# Patient Record
Sex: Female | Born: 1935 | Race: White | Hispanic: No
Health system: Southern US, Community
[De-identification: ages and names within clinical notes are randomized; demographics above are authoritative.]

---

## 2004-12-22 ENCOUNTER — Ambulatory Visit: Payer: Self-pay | Admitting: *Deleted

## 2005-08-29 ENCOUNTER — Ambulatory Visit: Payer: Self-pay | Admitting: Family Medicine

## 2005-09-14 ENCOUNTER — Ambulatory Visit: Payer: Self-pay | Admitting: Family Medicine

## 2005-10-07 ENCOUNTER — Emergency Department: Payer: Self-pay | Admitting: Emergency Medicine

## 2005-10-12 ENCOUNTER — Ambulatory Visit: Payer: Self-pay | Admitting: Family Medicine

## 2006-08-09 ENCOUNTER — Ambulatory Visit: Payer: Self-pay | Admitting: Family Medicine

## 2007-05-06 ENCOUNTER — Ambulatory Visit: Payer: Self-pay | Admitting: Vascular Surgery

## 2007-11-11 ENCOUNTER — Encounter: Payer: Self-pay | Admitting: Internal Medicine

## 2007-11-13 ENCOUNTER — Encounter: Payer: Self-pay | Admitting: Internal Medicine

## 2007-12-13 ENCOUNTER — Encounter: Payer: Self-pay | Admitting: Internal Medicine

## 2008-01-24 ENCOUNTER — Encounter: Payer: Self-pay | Admitting: Internal Medicine

## 2008-02-07 ENCOUNTER — Inpatient Hospital Stay: Payer: Self-pay | Admitting: Vascular Surgery

## 2008-02-12 ENCOUNTER — Encounter: Payer: Self-pay | Admitting: Internal Medicine

## 2008-02-27 ENCOUNTER — Observation Stay: Payer: Self-pay | Admitting: Vascular Surgery

## 2008-02-27 ENCOUNTER — Other Ambulatory Visit: Payer: Self-pay

## 2008-03-14 ENCOUNTER — Encounter: Payer: Self-pay | Admitting: Internal Medicine

## 2008-04-14 ENCOUNTER — Encounter: Payer: Self-pay | Admitting: Internal Medicine

## 2008-10-14 ENCOUNTER — Ambulatory Visit: Payer: Self-pay | Admitting: Vascular Surgery

## 2009-03-18 ENCOUNTER — Ambulatory Visit: Payer: Self-pay | Admitting: Internal Medicine

## 2009-03-23 ENCOUNTER — Ambulatory Visit: Payer: Self-pay | Admitting: Internal Medicine

## 2009-03-26 ENCOUNTER — Ambulatory Visit: Payer: Self-pay | Admitting: Internal Medicine

## 2009-04-02 ENCOUNTER — Ambulatory Visit: Payer: Self-pay | Admitting: Internal Medicine

## 2009-10-27 ENCOUNTER — Ambulatory Visit: Payer: Self-pay | Admitting: Internal Medicine

## 2009-11-11 ENCOUNTER — Ambulatory Visit: Payer: Self-pay | Admitting: Internal Medicine

## 2009-11-19 ENCOUNTER — Ambulatory Visit: Payer: Self-pay | Admitting: Internal Medicine

## 2009-12-01 ENCOUNTER — Ambulatory Visit: Payer: Self-pay | Admitting: Internal Medicine

## 2009-12-08 ENCOUNTER — Ambulatory Visit: Payer: Self-pay | Admitting: Internal Medicine

## 2010-03-24 ENCOUNTER — Inpatient Hospital Stay: Payer: Self-pay | Admitting: Specialist

## 2010-06-07 ENCOUNTER — Ambulatory Visit: Payer: Self-pay | Admitting: Internal Medicine

## 2010-06-16 ENCOUNTER — Ambulatory Visit: Payer: Self-pay | Admitting: Internal Medicine

## 2010-06-30 ENCOUNTER — Ambulatory Visit: Payer: Self-pay | Admitting: Internal Medicine

## 2011-05-29 ENCOUNTER — Ambulatory Visit: Payer: Self-pay | Admitting: Vascular Surgery

## 2011-07-26 ENCOUNTER — Other Ambulatory Visit: Payer: Self-pay | Admitting: Family Medicine

## 2012-01-24 ENCOUNTER — Ambulatory Visit: Payer: Self-pay | Admitting: Vascular Surgery

## 2012-01-24 LAB — CBC
HCT: 37.9 % (ref 35.0–47.0)
MCHC: 32.5 g/dL (ref 32.0–36.0)
MCV: 96 fL (ref 80–100)
RDW: 13.7 % (ref 11.5–14.5)

## 2012-01-24 LAB — BASIC METABOLIC PANEL
Anion Gap: 8 (ref 7–16)
Calcium, Total: 9 mg/dL (ref 8.5–10.1)
Chloride: 103 mmol/L (ref 98–107)
Creatinine: 1.24 mg/dL (ref 0.60–1.30)
EGFR (African American): 48 — ABNORMAL LOW
EGFR (Non-African Amer.): 42 — ABNORMAL LOW
Glucose: 152 mg/dL — ABNORMAL HIGH (ref 65–99)
Osmolality: 283 (ref 275–301)
Sodium: 136 mmol/L (ref 136–145)

## 2012-01-25 ENCOUNTER — Emergency Department: Payer: Self-pay | Admitting: Unknown Physician Specialty

## 2012-01-25 LAB — COMPREHENSIVE METABOLIC PANEL
Alkaline Phosphatase: 97 U/L (ref 50–136)
Anion Gap: 6 — ABNORMAL LOW (ref 7–16)
BUN: 39 mg/dL — ABNORMAL HIGH (ref 7–18)
Bilirubin,Total: 0.4 mg/dL (ref 0.2–1.0)
Creatinine: 1.34 mg/dL — ABNORMAL HIGH (ref 0.60–1.30)
EGFR (African American): 44 — ABNORMAL LOW
EGFR (Non-African Amer.): 38 — ABNORMAL LOW
Glucose: 191 mg/dL — ABNORMAL HIGH (ref 65–99)
Osmolality: 288 (ref 275–301)
SGOT(AST): 25 U/L (ref 15–37)
Total Protein: 7.4 g/dL (ref 6.4–8.2)

## 2012-01-25 LAB — URINALYSIS, COMPLETE
Blood: NEGATIVE
Leukocyte Esterase: NEGATIVE
Nitrite: NEGATIVE
Ph: 5 (ref 4.5–8.0)
RBC,UR: 1 /HPF (ref 0–5)
Squamous Epithelial: 1

## 2012-01-25 LAB — CBC
HCT: 37 % (ref 35.0–47.0)
MCH: 31.4 pg (ref 26.0–34.0)
Platelet: 262 10*3/uL (ref 150–440)
RBC: 3.85 10*6/uL (ref 3.80–5.20)
WBC: 10.7 10*3/uL (ref 3.6–11.0)

## 2012-01-25 LAB — PROTIME-INR: Prothrombin Time: 13.5 secs (ref 11.5–14.7)

## 2012-01-25 LAB — TROPONIN I: Troponin-I: <0.02 ng/mL

## 2012-01-31 ENCOUNTER — Inpatient Hospital Stay: Payer: Self-pay | Admitting: Vascular Surgery

## 2012-01-31 LAB — CBC WITH DIFFERENTIAL/PLATELET
Basophil #: 0.1 10*3/uL (ref 0.0–0.1)
Comment - H1-Com1: NORMAL
Comment - H1-Com2: NORMAL
Eosinophil #: 0 10*3/uL (ref 0.0–0.7)
Eosinophil %: 0.1 %
HCT: 35.2 % (ref 35.0–47.0)
Lymphocyte #: 1 10*3/uL (ref 1.0–3.6)
Lymphocyte %: 3.8 %
Lymphocytes: 5 %
MCH: 30.9 pg (ref 26.0–34.0)
MCV: 98 fL (ref 80–100)
Monocyte %: 5.1 %
Monocytes: 3 %
Neutrophil #: 25.1 10*3/uL — ABNORMAL HIGH (ref 1.4–6.5)
Neutrophil %: 90.5 %
Platelet: 307 10*3/uL (ref 150–440)
RBC: 3.6 10*6/uL — ABNORMAL LOW (ref 3.80–5.20)
Segmented Neutrophils: 91 %
WBC: 27.7 10*3/uL — ABNORMAL HIGH (ref 3.6–11.0)

## 2012-01-31 LAB — CULTURE, BLOOD (SINGLE)

## 2012-02-01 LAB — CBC WITH DIFFERENTIAL/PLATELET
Basophil #: 0 10*3/uL (ref 0.0–0.1)
Basophil %: 0.1 %
Eosinophil #: 0 10*3/uL (ref 0.0–0.7)
Eosinophil %: 0 %
HCT: 32.2 % — ABNORMAL LOW (ref 35.0–47.0)
Lymphocyte #: 1.4 10*3/uL (ref 1.0–3.6)
Lymphocyte %: 9.9 %
MCHC: 33.2 g/dL (ref 32.0–36.0)
MCV: 96 fL (ref 80–100)
Monocyte #: 1 x10 3/mm — ABNORMAL HIGH (ref 0.2–0.9)
Monocyte %: 6.6 %
Neutrophil #: 12.2 10*3/uL — ABNORMAL HIGH (ref 1.4–6.5)
Neutrophil %: 83.4 %
Platelet: 304 10*3/uL (ref 150–440)
RBC: 3.35 10*6/uL — ABNORMAL LOW (ref 3.80–5.20)
RDW: 13.5 % (ref 11.5–14.5)

## 2012-02-01 LAB — BASIC METABOLIC PANEL
BUN: 32 mg/dL — ABNORMAL HIGH (ref 7–18)
Calcium, Total: 8.5 mg/dL (ref 8.5–10.1)
Chloride: 106 mmol/L (ref 98–107)
Co2: 27 mmol/L (ref 21–32)
Creatinine: 1.45 mg/dL — ABNORMAL HIGH (ref 0.60–1.30)
EGFR (Non-African Amer.): 34 — ABNORMAL LOW
Glucose: 203 mg/dL — ABNORMAL HIGH (ref 65–99)
Osmolality: 292 (ref 275–301)
Potassium: 4.4 mmol/L (ref 3.5–5.1)

## 2012-02-01 LAB — CK-MB
CK-MB: 5.8 ng/mL — ABNORMAL HIGH (ref 0.5–3.6)
CK-MB: 3.9 ng/mL — ABNORMAL HIGH (ref 0.5–3.6)

## 2012-02-01 LAB — TROPONIN I: Troponin-I: 0.03 ng/mL

## 2012-08-18 IMAGING — CT CT HEAD WITHOUT CONTRAST
3 of 4 series · 17 of 30 positions shown, 19 images · non-contrast
Comparison: none

REASON FOR EXAM: ams cva
COMMENTS:

PROCEDURE:     CT  - CT HEAD WITHOUT CONTRAST  - January 25, 2012  [DATE]
RESULT:     Head CT dated 01/25/2012.
TECHNIQUE: Helical noncontrasted 5 mm sections were obtained from the skull
base to the vertex. There is made to prior study dated 03/24/2010.

[Series 2: without · axial · non-contrast · 0.43mm/px · z∈[+408,+512]mm · 5 of 33 slices shown]
[im 6/33  brain]
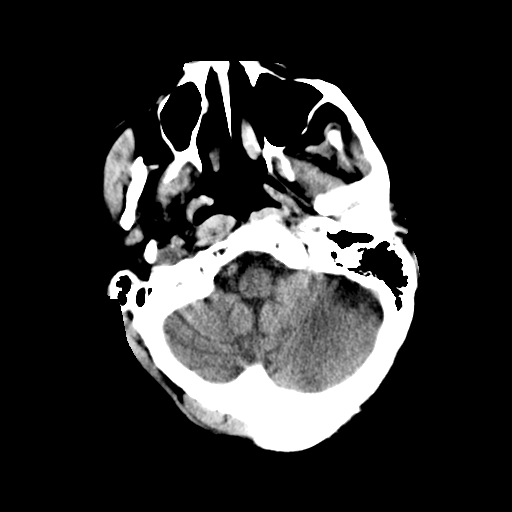
[im 11/33  brain]
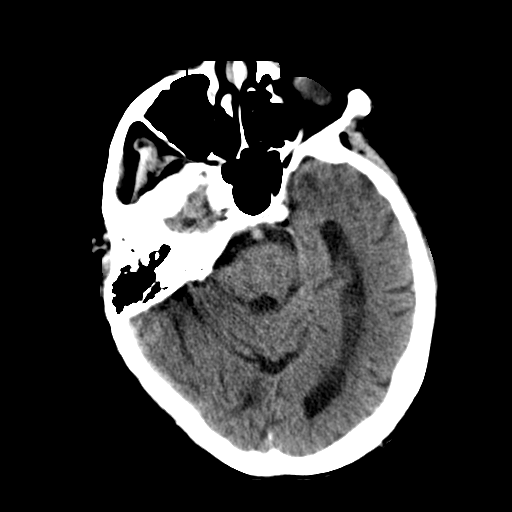
[im 17/33  brain]
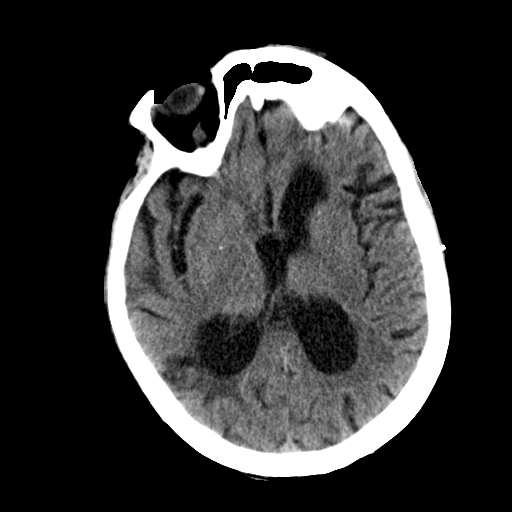
[im 22/33  brain]
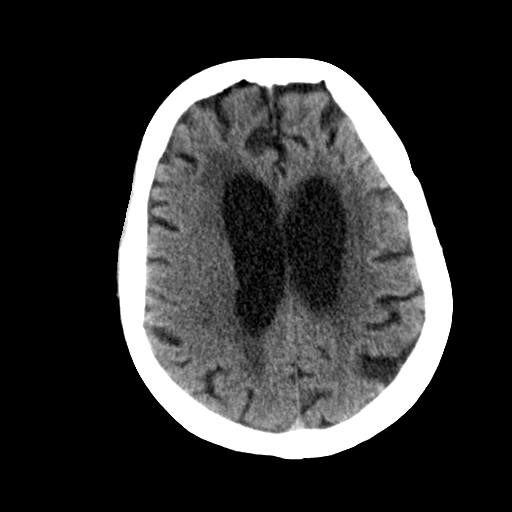
[im 27/33  brain]
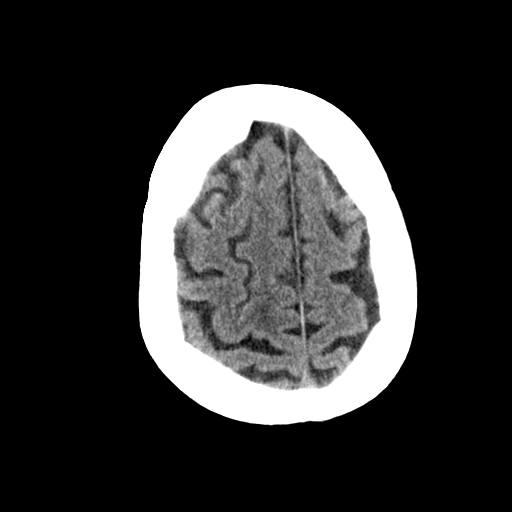

[Series 4: without recon · axial · non-contrast · 0.43mm/px · z∈[+425,+543]mm · 6 of 34 slices shown, 8 images]
[im 5/34  brain]
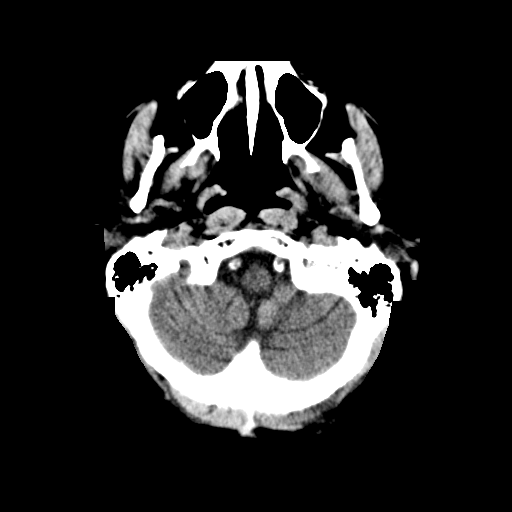
[im 5/34  bone]
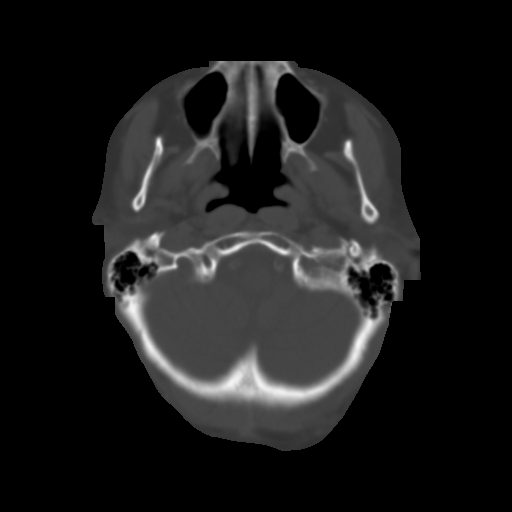
[im 10/34  brain]
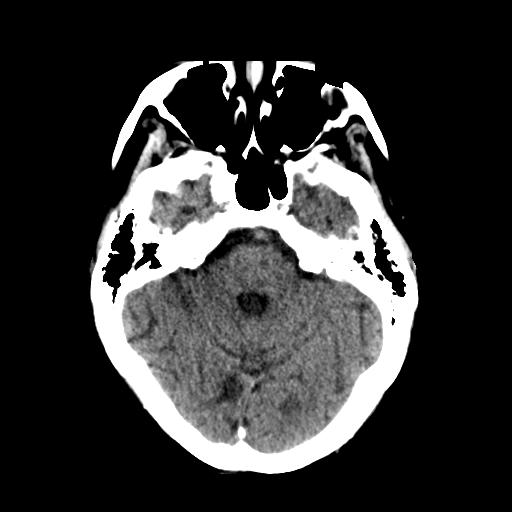
[im 15/34  brain]
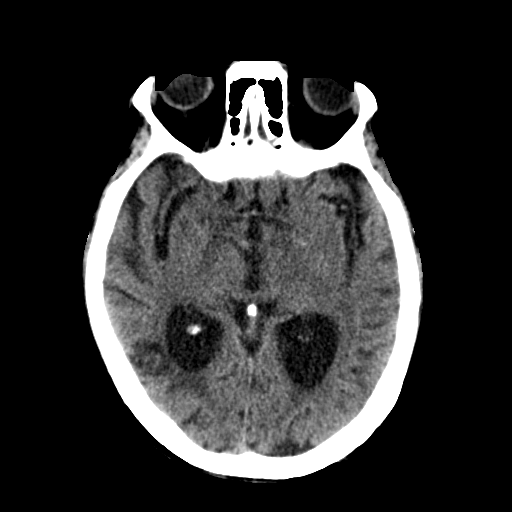
[im 19/34  brain]
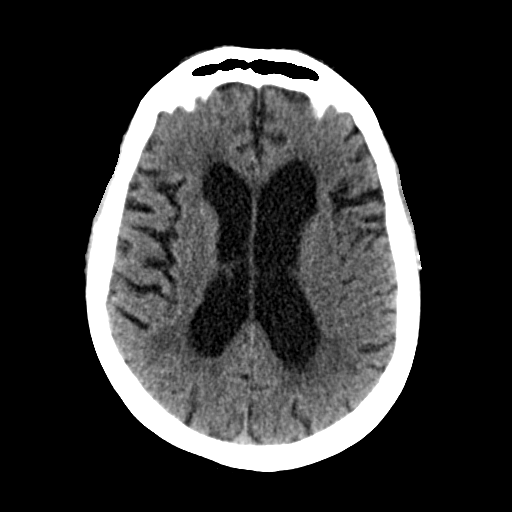
[im 24/34  brain]
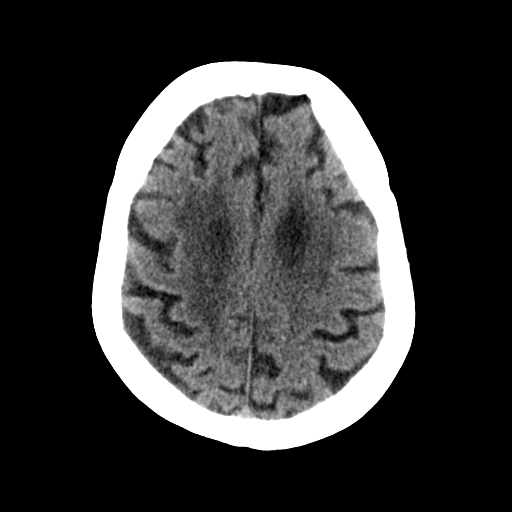
[im 24/34  bone]
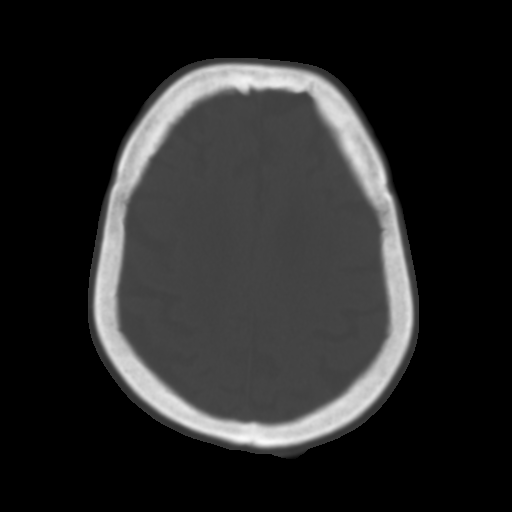
[im 29/34  brain]
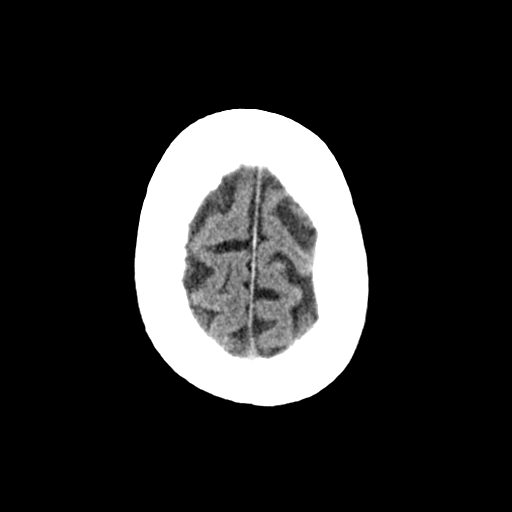

[Series 5: bone recon · axial · 0.43mm/px · z∈[+425,+543]mm · 6 of 34 slices shown]
[im 5/34  bone]
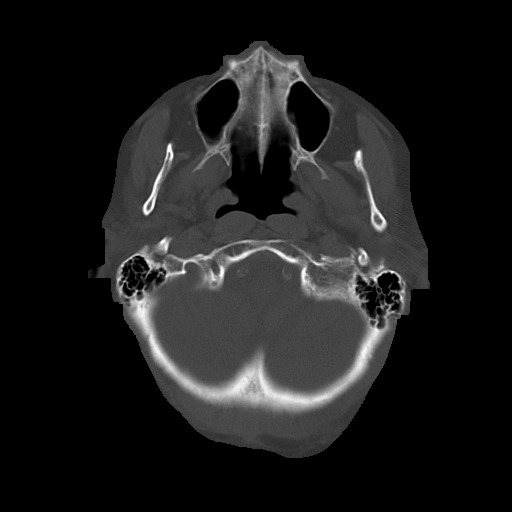
[im 10/34  bone]
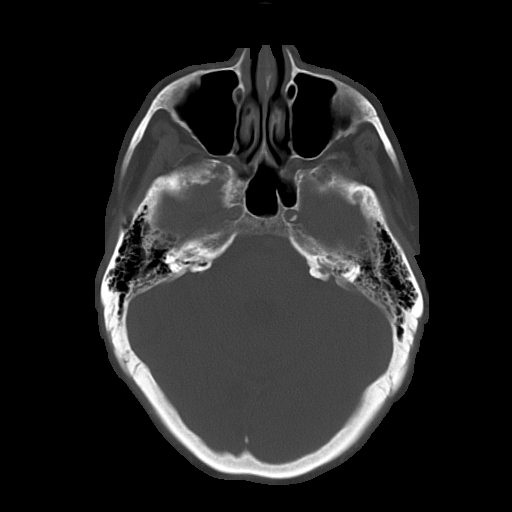
[im 15/34  bone]
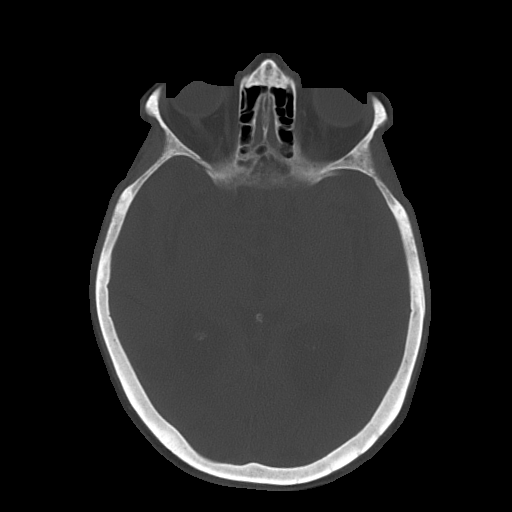
[im 19/34  bone]
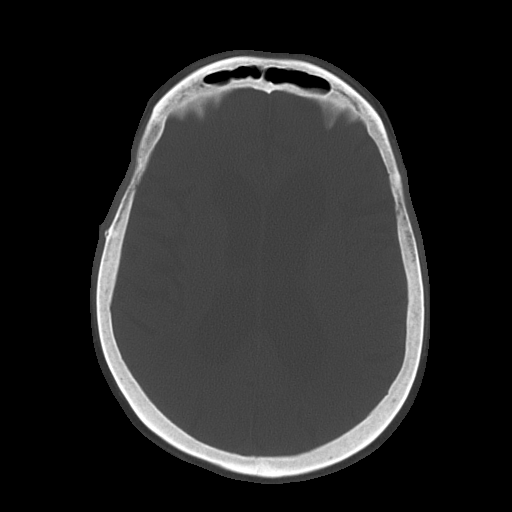
[im 24/34  bone]
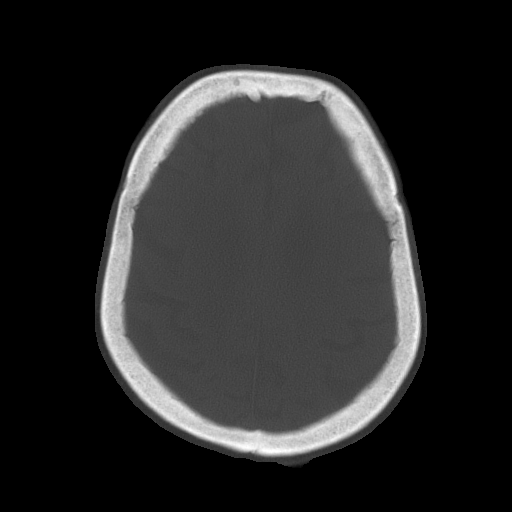
[im 29/34  bone]
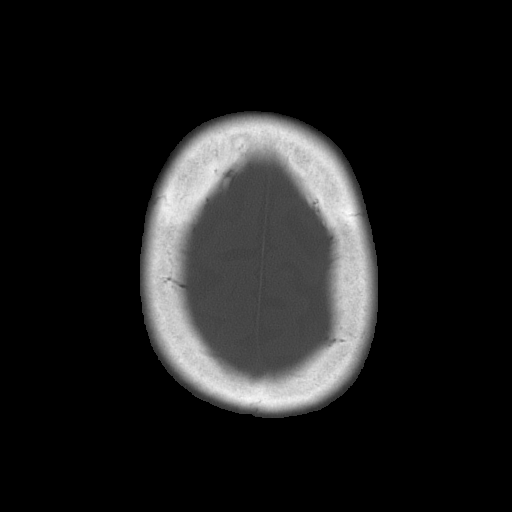

[17 of 30 positions shown; findings below may reference images not displayed]

FINDINGS: There is no evidence of intra-axial nor extra-axial fluid
collections. There is diffuse atrophy and hydrocephalus vacuo. Diffuse
moderate to severe areas of low-attenuation project within the subcortical,
deep, and periventricular white matter regions. There is no evidence of
subfalcine or tonsillar herniation. Is no evidence of a depressed skull
fracture. The mastoid air cells are patent. Visualized paranasal sinuses are
unremarkable.
IMPRESSION: Chronic and involutional changes without evidence of acute
abnormalities.

## 2012-09-14 DEATH — deceased

## 2014-12-06 NOTE — Consult Note (Signed)
PATIENT NAME:  Pamela Glass, LEATON MR#:  [id number] DATE OF BIRTH:  April 10, 1931  DATE OF CONSULTATION:  02/01/2012  REFERRING PHYSICIAN:  Festus Barren, MD  CONSULTING PHYSICIAN:  Hieu Herms A. Allena Katz, MD  PRIMARY CARE PHYSICIAN: Burley Saver, MD   REASON FOR CONSULTATION: Management of medical problems. The patient is status post left above-knee amputation secondary to peripheral arterial disease with gangrene on the left foot.   HISTORY OF PRESENT ILLNESS: Pamela Glass is a 81 year old Caucasian female who has significant dementia, type 2 diabetes, history of hypertension, hyperlipidemia, is admitted on the Surgical Floor, and Medical consultation was requested to manage her medical problems. The patient currently is postoperative day one of left above-knee amputation and is doing well. She has dementia. She does not answer any of my questions. She appears to be tired, per her daughter. Per daughter, she tolerated the surgery well.   PAST MEDICAL HISTORY: 1. Deep vein thrombosis.  2. Hypercholesterolemia.  3. Hypertension.  4. Coronary artery disease, status post bypass.  5. Type 2 diabetes.  6. L2-L3-L4 decompression and fusion surgery.  7. Left knee replacement.  8. Dementia.  9. Bladder surgery x3.  10. Peripheral vascular disease with history of multiple revascularizations and PTCAs.  11. Chronic kidney disease. 12. Depression.   FAMILY HISTORY: Heart attack, strokes, and high blood pressure.   SOCIAL HISTORY: She lives in [place]. She has 24/7 care at home. Nonsmoker. Nonalcoholic.  REVIEW OF SYSTEMS: Review of systems is unobtainable. The patient has dementia and is not answering any questions.    ALLERGIES: No known drug allergies.   MEDICATIONS:  1. Acetaminophen/hydrocodone 5/500, 1 every 4 to 6 hours.  2. Aspirin 81 mg daily.  3. Citalopram 20 mg daily.  4. Docusate 100 mg, 1 capsule daily.  5. Duragesic 12 Extended-Release, 1 patch every 72 hours.  6. Lasix 40 mg, 1 to 2  tablets daily as needed.  7. Glipizide 10 mg b.i.d.  8. Magnesium oxide 1 tablet daily.  9. Metoprolol 50 mg daily.  10. Multivitamin p.o. daily.  11. Omeprazole 20 mg, 2 capsules daily.  12. Ramipril 10 mg daily.  13. Temazepam 15 mg, 1 to 2 capsules orally daily at bedtime.   PHYSICAL EXAMINATION:  GENERAL: Exam is again limited. The patient appears a little bit lethargic, sleepy.   VITAL SIGNS: She is afebrile, pulse is 111, blood pressure is 94/66. Saturations are 100% on 2 liters. She is an elderly-looking female who appears to be somewhat lethargic.   HEENT: Atraumatic, normocephalic. Pupils are equal, round and reactive to light and accommodation. Oral mucosa normal.   NECK: Supple. No JVD. No carotid bruit.   RESPIRATORY: Clear to auscultation bilaterally. No rales, rhonchi, respiratory distress, or labored breathing.   CARDIOVASCULAR: Tachycardia present. No murmur heard. PMI not lateralized. Chest is nontender.   EXTREMITIES: Feeble pedal pulses on the right. She has good femoral pulses. Left above-knee amputation with amputation stump with dressing present.  NEUROLOGIC: Unable to test given dementia.   LABORATORY, DIAGNOSTIC AND RADIOLOGICAL DATA:  Troponin is 0.03.  White count is 14.6, hemoglobin and hematocrit is 10.7 and 32.2, platelet count is 304.  Glucose is 203, BUN is 32, creatinine is 1.4, sodium is 140, potassium is 4.4, chloride is 106, bicarbonate is 27, calcium is 8.5.  Wound culture from June 13th showed MRSA.   ASSESSMENT: Pamela Glass is a 81 year old with:  1. Type 2 diabetes: We will continue sliding scale insulin and glipizide. The patient seems  to be eating well. The dosage will be adjusted. If sugars remain higher, we will have to add long-acting  subcutaneous insulin.   2. Hypertension: Continue metoprolol and ramipril.  3. Status post left above-knee amputation due to gangrene and peripheral arterial disease, per Dr. Wyn Quakerew.  4. Gastroesophageal reflux  disease: On PPI.  5. Dementia: The patient appears to be stable at this time.   Thank you for the consult. The above was discussed with the patient's daughter. The patient is followed by Hospice as well. She overall tolerated her surgery well. We will follow along with you.   TIME SPENT: 50 minutes.   ____________________________ Wylie HailSona A. Allena KatzPatel, MD sap:cbb D: 02/01/2012 15:45:54 ET T: 02/01/2012 16:56:50 ET JOB#: [id number]  cc: Kathrina Crosley A. Allena KatzPatel, MD, <Dictator> Burley SaverL. Katherine Bliss, MD Willow OraSONA A Ramisa Duman MD ELECTRONICALLY SIGNED 02/03/2012 15:46

## 2014-12-06 NOTE — Discharge Summary (Signed)
PATIENT NAME:  Pamela Glass, Pamela Glass MR#:  [id number] DATE OF BIRTH:  [birth year]  DATE OF ADMISSION:  01/31/2012 DATE OF DISCHARGE:  02/05/2012  FINAL DIAGNOSES:  1. Peripheral arterial disease with gangrene of the left foot.  2. Diabetes mellitus.  3. Chronic kidney disease, stage III.  4. Severe dementia.   PROCEDURE PERFORMED DURING THIS ADMISSION: Left above-knee amputation done by Dr. Festus BarrenJason Dew on 01/31/2012.   COMPLICATIONS: None.   CONSULTATIONS: None.   HOSPITAL COURSE: The patient was admitted. She underwent her procedure on 01/31/2012 without complication. Following surgery she was transferred from the postanesthesia care unit to the surgical floor where vital signs remained stable. She did receive preoperative and postoperative IV antibiotics and required no transfusions during this admission. Her left leg incision site remained clean, dry, and intact. She progressed in her healing and was deemed stable for discharge on 02/05/2012.   LABORATORY DATA: As of 02/01/2012 hemoglobin 10.7, hematocrit 32.2. Chemistries appear stable.   DISCHARGE MEDICATIONS:  1. Ramipril 10 mg oral capsule.  2. Temazepam 15 mg oral.  3. Triple antibiotic topical ointment.  4. Cephalexin 500 mg oral capsule.  5. Aspirin 81 mg.  6. Citalopram 20 mg. 7. Multivitamin.  8. Duragesic transdermal film.  9. Furosemide 40 mg.  10. Glipizide 10 mg.  11. Norco 5/325, one tablet every six hours.  12. Magnesium oxide 400 mg.  13. Metoprolol succinate 50 mg.  14. NovoLog 100 units.  15. Nystatin 100,000 units topical powder.  16. Omeprazole 20 mg oral delayed release capsule.   DISPOSITION: The patient is discharged to her nursing facility in stable condition on 02/05/2012. Surgical clips may be removed on her follow-up visit in three weeks. The patient does have a follow-up appointment with Dr. Festus BarrenJason Dew in three weeks. The patient understands to call with any questions or concerns in the interim.    ____________________________ Terrence DupontJason M. Voyd Groft, PA-C jmk:ap D: 02/20/2012 12:53:57 ET T: 02/20/2012 13:07:42 ET JOB#: [id number]  cc: Terrence DupontJason M. Braylen Denunzio, PA-C, <Dictator> Terrence DupontJASON M Zykiria Bruening PA ELECTRONICALLY SIGNED 02/22/2012 10:26

## 2014-12-06 NOTE — Consult Note (Signed)
Referrring MD Dr DewKatherine Quillian QuinceBliss Dm-2SSi and Glipizide ramipril s/p left AKA due to gangrene and PADDr dew on PPI  dtr    Electronic Signatures: Willow OraPatel, Selassie Spatafore A (MD)  (Signed on 20-Jun-13 15:37)  Authored  Last Updated: 20-Jun-13 15:37 by Willow OraPatel, Maisyn Nouri A (MD)

## 2014-12-06 NOTE — Op Note (Signed)
PATIENT NAME:  Pamela Glass, Dollene F MR#:  [id number] DATE OF BIRTH:  08/03/1931  DATE OF PROCEDURE:  01/31/2012  PREOPERATIVE DIAGNOSES:  1. Peripheral arterial disease with gangrene, left foot.  2. Diabetes mellitus.  3. Chronic kidney disease stage III.   POSTOPERATIVE DIAGNOSES:  1. Peripheral arterial disease with gangrene, left foot.  2. Diabetes mellitus.  3. Chronic kidney disease stage III.   PROCEDURE: Left above-knee amputation.   SURGEON: Annice NeedyJason S. Dew, MD  ANESTHESIA: General.   ESTIMATED BLOOD LOSS: Approximately 300 mL.   INDICATION FOR PROCEDURE: 81 year old debilitated female with dementia who is bedbound and has a gangrenous left foot. Above-knee amputation is recommended. Risks and benefits were discussed. Informed consent was obtained.   DESCRIPTION OF PROCEDURE: Patient is brought to the operative suite and after an adequate level of general anesthesia was obtained, the left lower extremity was sterilely prepped and draped, a sterile surgical field was created. A fishmouth incision was created in the lower thigh on the left. We dissected down through the muscle with electrocautery. The vascular bundles were ligated and divided between silk ties with a stick tie placed in the superficial femoral artery and superficial femoral vein I dissected out the bone well proximally, transected this with the oscillating saw and completed our posterior flap with the amputation knife. We used hemostats to control all vascular structures and tied these off with 2-0 silk stick ties or free ties. The wound was then irrigated. A deep layer of 0 Vicryl sutures were used to close the deep tissue, superficial layer of 0 Vicryl x2 were used to close the superficial fascia, 2-0 Vicryl sutures were used to close subcutaneous space and the skin was coapted with staples. Sterile dressing was placed. The patient tolerated procedure well and was taken to the recovery room in stable condition.     ____________________________ Annice NeedyJason S. Dew, MD jsd:cms D: 01/31/2012 17:39:25 ET T: 02/01/2012 08:20:40 ET JOB#: [id number]  cc: Annice NeedyJason S. Dew, MD, <Dictator> Annice NeedyJASON S DEW MD ELECTRONICALLY SIGNED 02/01/2012 9:07
